# Patient Record
Sex: Male | Born: 1980 | Race: White | Hispanic: No | Marital: Single | Smoking: Current every day smoker
Health system: Southern US, Community
[De-identification: ages and names within clinical notes are randomized; demographics above are authoritative.]

## PROBLEM LIST (undated history)

## (undated) DIAGNOSIS — S42009A Fracture of unspecified part of unspecified clavicle, initial encounter for closed fracture: Secondary | ICD-10-CM

---
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
## 2008-07-11 ENCOUNTER — Emergency Department (HOSPITAL_BASED_OUTPATIENT_CLINIC_OR_DEPARTMENT_OTHER): Admission: EM | Admit: 2008-07-11 | Discharge: 2008-07-11 | Payer: Self-pay | Admitting: Emergency Medicine

## 2010-05-20 ENCOUNTER — Emergency Department (HOSPITAL_COMMUNITY): Admission: EM | Admit: 2010-05-20 | Discharge: 2010-05-20 | Payer: Self-pay | Admitting: Emergency Medicine

## 2012-06-15 ENCOUNTER — Emergency Department: Payer: Self-pay | Admitting: Emergency Medicine

## 2014-03-04 ENCOUNTER — Emergency Department (HOSPITAL_BASED_OUTPATIENT_CLINIC_OR_DEPARTMENT_OTHER)
Admission: EM | Admit: 2014-03-04 | Discharge: 2014-03-04 | Disposition: A | Discharge status: Home / Self Care | Payer: Self-pay | Attending: Emergency Medicine | Admitting: Emergency Medicine

## 2014-03-04 ENCOUNTER — Emergency Department (HOSPITAL_BASED_OUTPATIENT_CLINIC_OR_DEPARTMENT_OTHER): Payer: Self-pay

## 2014-03-04 ENCOUNTER — Encounter (HOSPITAL_BASED_OUTPATIENT_CLINIC_OR_DEPARTMENT_OTHER): Payer: Self-pay | Admitting: Emergency Medicine

## 2014-03-04 DIAGNOSIS — Z8781 Personal history of (healed) traumatic fracture: Secondary | ICD-10-CM | POA: Insufficient documentation

## 2014-03-04 DIAGNOSIS — R61 Generalized hyperhidrosis: Secondary | ICD-10-CM | POA: Insufficient documentation

## 2014-03-04 DIAGNOSIS — Z23 Encounter for immunization: Secondary | ICD-10-CM | POA: Insufficient documentation

## 2014-03-04 DIAGNOSIS — R55 Syncope and collapse: Secondary | ICD-10-CM | POA: Insufficient documentation

## 2014-03-04 DIAGNOSIS — S21209A Unspecified open wound of unspecified back wall of thorax without penetration into thoracic cavity, initial encounter: Secondary | ICD-10-CM | POA: Insufficient documentation

## 2014-03-04 DIAGNOSIS — S21219A Laceration without foreign body of unspecified back wall of thorax without penetration into thoracic cavity, initial encounter: Secondary | ICD-10-CM

## 2014-03-04 DIAGNOSIS — F17200 Nicotine dependence, unspecified, uncomplicated: Secondary | ICD-10-CM | POA: Insufficient documentation

## 2014-03-04 HISTORY — DX: Fracture of unspecified part of unspecified clavicle, initial encounter for closed fracture: S42.009A

## 2014-03-04 LAB — BASIC METABOLIC PANEL
BUN: 19 mg/dL (ref 6–23)
CHLORIDE: 94 meq/L — AB (ref 96–112)
CO2: 26 mEq/L (ref 19–32)
Calcium: 9.6 mg/dL (ref 8.4–10.5)
Creatinine, Ser: 1.1 mg/dL (ref 0.50–1.35)
GFR, EST NON AFRICAN AMERICAN: 89 mL/min — AB (ref >90)
Glucose, Bld: 120 mg/dL — ABNORMAL HIGH (ref 70–99)
Potassium: 4.1 mEq/L (ref 3.7–5.3)
Sodium: 136 mEq/L — ABNORMAL LOW (ref 137–147)

## 2014-03-04 LAB — URINALYSIS, ROUTINE W REFLEX MICROSCOPIC
Glucose, UA: NEGATIVE mg/dL
Hgb urine dipstick: NEGATIVE
KETONES UR: 15 mg/dL — AB
Leukocytes, UA: NEGATIVE
NITRITE: NEGATIVE
Protein, ur: 100 mg/dL — AB
Specific Gravity, Urine: 1.036 — ABNORMAL HIGH (ref 1.005–1.030)
Urobilinogen, UA: 1 mg/dL (ref 0.0–1.0)
pH: 5.5 (ref 5.0–8.0)

## 2014-03-04 LAB — CBC WITH DIFFERENTIAL/PLATELET
BASOS PCT: 0 % (ref 0–1)
Basophils Absolute: 0 10*3/uL (ref 0.0–0.1)
EOS ABS: 0 10*3/uL (ref 0.0–0.7)
Eosinophils Relative: 0 % (ref 0–5)
HEMATOCRIT: 46.7 % (ref 39.0–52.0)
HEMOGLOBIN: 16.8 g/dL (ref 13.0–17.0)
Lymphocytes Relative: 14 % (ref 12–46)
Lymphs Abs: 1.4 10*3/uL (ref 0.7–4.0)
MCH: 34 pg (ref 26.0–34.0)
MCHC: 36 g/dL (ref 30.0–36.0)
MCV: 94.5 fL (ref 78.0–100.0)
MONOS PCT: 11 % (ref 3–12)
Monocytes Absolute: 1.2 10*3/uL — ABNORMAL HIGH (ref 0.1–1.0)
Neutro Abs: 7.8 10*3/uL — ABNORMAL HIGH (ref 1.7–7.7)
Neutrophils Relative %: 75 % (ref 43–77)
Platelets: 217 10*3/uL (ref 150–400)
RBC: 4.94 MIL/uL (ref 4.22–5.81)
RDW: 12.8 % (ref 11.5–15.5)
WBC: 10.5 10*3/uL (ref 4.0–10.5)

## 2014-03-04 LAB — URINE MICROSCOPIC-ADD ON

## 2014-03-04 MED ORDER — CEPHALEXIN 500 MG PO CAPS: 500.0000 mg | ORAL_CAPSULE | Freq: Four times a day (QID) | ORAL | Status: DC

## 2014-03-04 MED ORDER — CEPHALEXIN 250 MG PO CAPS
250.0000 mg | ORAL_CAPSULE | Freq: Once | ORAL | Status: AC
  Administered 2014-03-04: 250 mg via ORAL
  Filled 2014-03-04: qty 1

## 2014-03-04 MED ORDER — IOHEXOL 300 MG/ML  SOLN
100.0000 mL | Freq: Once | INTRAMUSCULAR | Status: AC | PRN
  Administered 2014-03-04: 100 mL via INTRAVENOUS

## 2014-03-04 MED ORDER — SODIUM CHLORIDE 0.9 % IV BOLUS (SEPSIS)
1000.0000 mL | Freq: Once | INTRAVENOUS | Status: AC
  Administered 2014-03-04: 1000 mL via INTRAVENOUS

## 2014-03-04 MED ORDER — TETANUS-DIPHTH-ACELL PERTUSSIS 5-2.5-18.5 LF-MCG/0.5 IM SUSP
0.5000 mL | Freq: Once | INTRAMUSCULAR | Status: AC
Start: 2014-03-04 — End: 2014-03-04
  Administered 2014-03-04: 0.5 mL via INTRAMUSCULAR
  Filled 2014-03-04: qty 0.5
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
## 2014-03-04 NOTE — Discharge Instructions (Signed)
Please have wound rechecked in 2 days. Observe for any sign of drainage. Antibiotics have been prescribed and should be continued for entire course.   Delayed Wound Closure Sometimes, your health care provider will decide to delay closing a wound for several days. This is done when the wound is badly bruised, dirty, or when it has been several hours since the injury happened. By delaying the closure of your wound, the risk of infection is reduced. Wounds that are closed in 3 7 days after being cleaned up and dressed heal just as well as those that are closed right away. HOME CARE INSTRUCTIONS  Rest and elevate the injured area until the pain and swelling are gone.  Have your wound checked as instructed by your health care provider. SEEK MEDICAL CARE IF:  You develop unusual or increased swelling or redness around the wound.  You have increasing pain or tenderness.  There is increasing fluid (drainage) or a bad smelling drainage coming from the wound. Document Released: 12/01/2005 Document Revised: 08/03/2013 Document Reviewed: 05/31/2013 [facility] Patient Information 2014 [place], [place].

## 2014-03-04 NOTE — ED Notes (Signed)
Pt walking and standing in room without dizziness.  C/o increased pain when laying or sitting.  Small amount bloody drainage on outer gauze of dressing.
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
## 2014-03-04 NOTE — ED Notes (Signed)
Dr. Rosalia Hammersay has thoroughly assessed the wounds on the patient's back.  See her notes for documentation of these injuries.
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
## 2014-03-06 ENCOUNTER — Emergency Department (HOSPITAL_BASED_OUTPATIENT_CLINIC_OR_DEPARTMENT_OTHER)
Admission: EM | Admit: 2014-03-06 | Discharge: 2014-03-06 | Disposition: A | Discharge status: Home / Self Care | Payer: Self-pay | Attending: Emergency Medicine | Admitting: Emergency Medicine

## 2014-03-06 ENCOUNTER — Encounter (HOSPITAL_BASED_OUTPATIENT_CLINIC_OR_DEPARTMENT_OTHER): Payer: Self-pay | Admitting: Emergency Medicine

## 2014-03-06 DIAGNOSIS — F17200 Nicotine dependence, unspecified, uncomplicated: Secondary | ICD-10-CM | POA: Insufficient documentation

## 2014-03-06 DIAGNOSIS — Z5189 Encounter for other specified aftercare: Secondary | ICD-10-CM

## 2014-03-06 DIAGNOSIS — Z4801 Encounter for change or removal of surgical wound dressing: Secondary | ICD-10-CM | POA: Insufficient documentation

## 2014-03-06 MED ORDER — TRAMADOL HCL 50 MG PO TABS: 50.0000 mg | ORAL_TABLET | Freq: Four times a day (QID) | ORAL | Status: DC | PRN
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
## 2014-03-06 NOTE — Discharge Instructions (Signed)
Continue to keep wounds clean. Take Tramadol as needed for pain. Return to the ED with worsening or concerning symptoms.
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
## 2014-03-07 NOTE — ED Provider Notes (Signed)
Medical screening examination/treatment/procedure(s) were performed by non-physician practitioner and as supervising physician I was immediately available for consultation/collaboration.     Geoffery Lyonsouglas Baylin Gamblin, MD 03/07/14 [phone]

## 2014-12-09 ENCOUNTER — Emergency Department (HOSPITAL_BASED_OUTPATIENT_CLINIC_OR_DEPARTMENT_OTHER): Payer: Self-pay

## 2014-12-09 ENCOUNTER — Emergency Department (HOSPITAL_BASED_OUTPATIENT_CLINIC_OR_DEPARTMENT_OTHER)
Admission: EM | Admit: 2014-12-09 | Discharge: 2014-12-09 | Disposition: A | Discharge status: Home / Self Care | Payer: Self-pay | Attending: Emergency Medicine | Admitting: Emergency Medicine

## 2014-12-09 ENCOUNTER — Encounter (HOSPITAL_BASED_OUTPATIENT_CLINIC_OR_DEPARTMENT_OTHER): Payer: Self-pay

## 2014-12-09 DIAGNOSIS — Y9389 Activity, other specified: Secondary | ICD-10-CM | POA: Insufficient documentation

## 2014-12-09 DIAGNOSIS — S0990XA Unspecified injury of head, initial encounter: Secondary | ICD-10-CM

## 2014-12-09 DIAGNOSIS — Y92410 Unspecified street and highway as the place of occurrence of the external cause: Secondary | ICD-10-CM | POA: Insufficient documentation

## 2014-12-09 DIAGNOSIS — Z720 Tobacco use: Secondary | ICD-10-CM | POA: Insufficient documentation

## 2014-12-09 DIAGNOSIS — Z8781 Personal history of (healed) traumatic fracture: Secondary | ICD-10-CM | POA: Insufficient documentation

## 2014-12-09 DIAGNOSIS — S0083XA Contusion of other part of head, initial encounter: Secondary | ICD-10-CM | POA: Insufficient documentation

## 2014-12-09 DIAGNOSIS — Y998 Other external cause status: Secondary | ICD-10-CM | POA: Insufficient documentation

## 2014-12-09 DIAGNOSIS — F1010 Alcohol abuse, uncomplicated: Secondary | ICD-10-CM | POA: Insufficient documentation
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
## 2014-12-09 NOTE — ED Provider Notes (Signed)
I assumed care in signout Pt now awake/alert/ambulatory No ataxia He reports facial pain but no HA No neck pain No blurred vision.  No diplopia is reported He has no chest/abdominal/back tenderness/bruising No signs of acute extremity injury He has right facial tenderness but no stepoffs/crepitus.  No septal hematoma.  No dental injury/malocclusion.  Face appears stable He understands that without imaging we can not reliably diagnose any facial fractures - he understands this He is clinically sober BP 117/67 mmHg  Pulse 87  Temp(Src) 98.7 F (37.1 C) (Oral)  Resp 20  SpO2 98%   Joya Gaskinsonald W Viha Kriegel, MD 12/09/14 [phone]
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
## 2014-12-09 NOTE — ED Notes (Signed)
Per refused CT scans, states unable to pay for them, states he is not hurting in his head or neck. EDP notified states pt can refuse for now till pt sobers up and will reevaluate.
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
